# Patient Record
Sex: Male | Born: 1955 | Race: Black or African American | Hispanic: No | Marital: Married | Smoking: Current every day smoker
Health system: Southern US, Community
[De-identification: ages and names within clinical notes are randomized; demographics above are authoritative.]

## PROBLEM LIST (undated history)

## (undated) DIAGNOSIS — R918 Other nonspecific abnormal finding of lung field: Secondary | ICD-10-CM

## (undated) DIAGNOSIS — R0600 Dyspnea, unspecified: Secondary | ICD-10-CM

## (undated) DIAGNOSIS — Z720 Tobacco use: Secondary | ICD-10-CM

## (undated) DIAGNOSIS — C3491 Malignant neoplasm of unspecified part of right bronchus or lung: Secondary | ICD-10-CM

## (undated) DIAGNOSIS — I313 Pericardial effusion (noninflammatory): Secondary | ICD-10-CM

## (undated) DIAGNOSIS — E46 Unspecified protein-calorie malnutrition: Secondary | ICD-10-CM

## (undated) DIAGNOSIS — E871 Hypo-osmolality and hyponatremia: Secondary | ICD-10-CM

## (undated) DIAGNOSIS — I10 Essential (primary) hypertension: Secondary | ICD-10-CM

## (undated) DIAGNOSIS — E222 Syndrome of inappropriate secretion of antidiuretic hormone: Secondary | ICD-10-CM

## (undated) DIAGNOSIS — I3139 Other pericardial effusion (noninflammatory): Secondary | ICD-10-CM

## (undated) DIAGNOSIS — F1010 Alcohol abuse, uncomplicated: Secondary | ICD-10-CM

## (undated) DIAGNOSIS — J90 Pleural effusion, not elsewhere classified: Secondary | ICD-10-CM

---

## 2015-02-28 HISTORY — PX: BRONCHOSCOPY: SUR163

## 2015-03-18 ENCOUNTER — Emergency Department (HOSPITAL_COMMUNITY): Payer: Self-pay

## 2015-03-18 ENCOUNTER — Emergency Department (HOSPITAL_COMMUNITY)
Admission: EM | Admit: 2015-03-18 | Discharge: 2015-03-18 | Discharge status: Against Medical Advice | Payer: Self-pay | Attending: Emergency Medicine | Admitting: Emergency Medicine

## 2015-03-18 ENCOUNTER — Encounter (HOSPITAL_COMMUNITY): Payer: Self-pay | Admitting: Cardiology

## 2015-03-18 DIAGNOSIS — Z8709 Personal history of other diseases of the respiratory system: Secondary | ICD-10-CM | POA: Insufficient documentation

## 2015-03-18 DIAGNOSIS — R0600 Dyspnea, unspecified: Secondary | ICD-10-CM

## 2015-03-18 DIAGNOSIS — C3491 Malignant neoplasm of unspecified part of right bronchus or lung: Secondary | ICD-10-CM | POA: Insufficient documentation

## 2015-03-18 DIAGNOSIS — R918 Other nonspecific abnormal finding of lung field: Secondary | ICD-10-CM | POA: Insufficient documentation

## 2015-03-18 DIAGNOSIS — Z720 Tobacco use: Secondary | ICD-10-CM | POA: Insufficient documentation

## 2015-03-18 DIAGNOSIS — I10 Essential (primary) hypertension: Secondary | ICD-10-CM | POA: Insufficient documentation

## 2015-03-18 DIAGNOSIS — E222 Syndrome of inappropriate secretion of antidiuretic hormone: Secondary | ICD-10-CM | POA: Insufficient documentation

## 2015-03-18 DIAGNOSIS — Z79899 Other long term (current) drug therapy: Secondary | ICD-10-CM | POA: Insufficient documentation

## 2015-03-18 DIAGNOSIS — R0602 Shortness of breath: Secondary | ICD-10-CM

## 2015-03-18 HISTORY — DX: Other pericardial effusion (noninflammatory): I31.39

## 2015-03-18 HISTORY — DX: Alcohol abuse, uncomplicated: F10.10

## 2015-03-18 HISTORY — DX: Unspecified protein-calorie malnutrition: E46

## 2015-03-18 HISTORY — DX: Other nonspecific abnormal finding of lung field: R91.8

## 2015-03-18 HISTORY — DX: Pleural effusion, not elsewhere classified: J90

## 2015-03-18 HISTORY — DX: Pericardial effusion (noninflammatory): I31.3

## 2015-03-18 HISTORY — DX: Malignant neoplasm of unspecified part of right bronchus or lung: C34.91

## 2015-03-18 HISTORY — DX: Tobacco use: Z72.0

## 2015-03-18 HISTORY — DX: Hypo-osmolality and hyponatremia: E87.1

## 2015-03-18 HISTORY — DX: Essential (primary) hypertension: I10

## 2015-03-18 HISTORY — DX: Dyspnea, unspecified: R06.00

## 2015-03-18 HISTORY — DX: Syndrome of inappropriate secretion of antidiuretic hormone: E22.2

## 2015-03-18 LAB — COMPREHENSIVE METABOLIC PANEL
ALBUMIN: 2.9 g/dL — AB (ref 3.5–5.0)
ALK PHOS: 94 U/L (ref 38–126)
ALT: 20 U/L (ref 17–63)
ANION GAP: 9 (ref 5–15)
AST: 28 U/L (ref 15–41)
BUN: 9 mg/dL (ref 6–20)
CHLORIDE: 87 mmol/L — AB (ref 101–111)
CO2: 26 mmol/L (ref 22–32)
Calcium: 8.5 mg/dL — ABNORMAL LOW (ref 8.9–10.3)
Creatinine, Ser: 0.46 mg/dL — ABNORMAL LOW (ref 0.61–1.24)
GFR calc Af Amer: >60 mL/min (ref >60)
GFR calc non Af Amer: >60 mL/min (ref >60)
Glucose, Bld: 97 mg/dL (ref 65–99)
POTASSIUM: 4.1 mmol/L (ref 3.5–5.1)
Sodium: 122 mmol/L — ABNORMAL LOW (ref 135–145)
Total Bilirubin: 0.6 mg/dL (ref 0.3–1.2)
Total Protein: 7.2 g/dL (ref 6.5–8.1)

## 2015-03-18 LAB — CBC WITH DIFFERENTIAL/PLATELET
Basophils Absolute: 0 10*3/uL (ref 0.0–0.1)
Basophils Relative: 0 % (ref 0–1)
Eosinophils Absolute: 0 10*3/uL (ref 0.0–0.7)
Eosinophils Relative: 0 % (ref 0–5)
HEMATOCRIT: 35.5 % — AB (ref 39.0–52.0)
HEMOGLOBIN: 12 g/dL — AB (ref 13.0–17.0)
LYMPHS ABS: 1.4 10*3/uL (ref 0.7–4.0)
LYMPHS PCT: 9 % — AB (ref 12–46)
MCH: 28.8 pg (ref 26.0–34.0)
MCHC: 33.8 g/dL (ref 30.0–36.0)
MCV: 85.1 fL (ref 78.0–100.0)
MONO ABS: 1.3 10*3/uL — AB (ref 0.1–1.0)
Monocytes Relative: 8 % (ref 3–12)
Neutro Abs: 13 10*3/uL — ABNORMAL HIGH (ref 1.7–7.7)
Neutrophils Relative %: 83 % — ABNORMAL HIGH (ref 43–77)
Platelets: 569 10*3/uL — ABNORMAL HIGH (ref 150–400)
RBC: 4.17 MIL/uL — ABNORMAL LOW (ref 4.22–5.81)
RDW: 12.8 % (ref 11.5–15.5)
WBC: 15.7 10*3/uL — ABNORMAL HIGH (ref 4.0–10.5)

## 2015-03-18 LAB — URINALYSIS, ROUTINE W REFLEX MICROSCOPIC
Bilirubin Urine: NEGATIVE
Glucose, UA: NEGATIVE mg/dL
HGB URINE DIPSTICK: NEGATIVE
KETONES UR: NEGATIVE mg/dL
Leukocytes, UA: NEGATIVE
Nitrite: NEGATIVE
PROTEIN: NEGATIVE mg/dL
Specific Gravity, Urine: 1.025 (ref 1.005–1.030)
UROBILINOGEN UA: 1 mg/dL (ref 0.0–1.0)
pH: 6.5 (ref 5.0–8.0)

## 2015-03-18 LAB — BRAIN NATRIURETIC PEPTIDE: B NATRIURETIC PEPTIDE 5: 133 pg/mL — AB (ref 0.0–100.0)

## 2015-03-18 LAB — TROPONIN I

## 2015-03-18 MED ORDER — IPRATROPIUM-ALBUTEROL 0.5-2.5 (3) MG/3ML IN SOLN
3.0000 mL | Freq: Once | RESPIRATORY_TRACT | Status: AC
  Administered 2015-03-18: 3 mL via RESPIRATORY_TRACT
  Filled 2015-03-18: qty 3

## 2015-03-18 NOTE — ED Notes (Signed)
Sob for months that is getting worse.  Chest pain ,  Right shoulder and upper back pain.  Has been seen for this and told he had fluid on his lungs and was told he has a mass.  To be following up for the mass.

## 2015-03-18 NOTE — ED Notes (Signed)
MD stated okay to d/c d/t pt requesting d/c at this time.

## 2015-03-18 NOTE — ED Notes (Signed)
Dr. Thurnell Garbe approved patient having water.  Grayland Ormond took patient a cup of water.   Advised patient we still needed a urine specimen.

## 2015-03-18 NOTE — ED Notes (Signed)
Patient o2 sat maintained at 93-97% while ambulating on room air.  Patient was coughing but could talk to me while walking.

## 2015-03-18 NOTE — ED Notes (Signed)
MD at bedside. 

## 2015-03-18 NOTE — ED Provider Notes (Signed)
CSN: [id number]     Arrival date & time 03/18/15  1113 History   First MD Initiated Contact with Patient 03/18/15 1205     Chief Complaint  Patient presents with  . Shortness of Breath      HPI Pt was seen at 1210.  Per pt and his family, c/o gradual onset and persistence of constant SOB and right sided chest "pain" for the past 6 months. Pain radiates into the right side of his back. Has been associated with cough. Pt states he was evaluated at [facility] for same and told he "had a mass in my chest" and "fluid on my lungs." Pt states "then they didn't do anything about it." Pt states he was admitted to [facility] 2 weeks ago "but nothing was done" and "they just took my blood pressure." Pt states he came to the ED today "because I want another opinion" and "I want something done." Pt denies any change in his symptoms. Denies fevers, no abd pain, no N/V/D, no palpitations, no calf/LE pain or unilateral swelling.    Past Medical History  Diagnosis Date  . Hypertension   . Lung mass     being worked up in [place] Dr. Frutoso Schatz, Pulm Dr. Koleen Nimrod  . Hyponatremia   . 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 Alcohol abuse   . Dyspnea     on home O2  . Tobacco abuse   . Malnutrition   . Pleural effusion, bilateral   . Pericardial effusion   . Non-small cell cancer of right lung    Past Surgical History  Procedure Laterality Date  . Bronchoscopy  02/28/15    [facility]    History  Substance Use Topics  . Smoking status: Current Every Day Smoker  . Smokeless tobacco: Not on file  . Alcohol Use: Yes     Comment: 2-3 beers daily     Review of Systems ROS: Statement: All systems negative except as marked or noted in the HPI; Constitutional: Negative for fever and chills. ; ; Eyes: Negative for eye pain, redness and discharge. ; ; ENMT: Negative for ear pain, hoarseness, nasal congestion, sinus pressure and  sore throat. ; ; Cardiovascular: +"CP for 6 months." Negative for palpitations, diaphoresis, and peripheral edema. ; ; Respiratory: +"SOB and cough for 6 months." Negative for wheezing and stridor. ; ; Gastrointestinal: Negative for nausea, vomiting, diarrhea, abdominal pain, blood in stool, hematemesis, jaundice and rectal bleeding. . ; ; Genitourinary: Negative for dysuria, flank pain and hematuria. ; ; Musculoskeletal: Negative for back pain and neck pain. Negative for swelling and trauma.; ; Skin: Negative for pruritus, rash, abrasions, blisters, bruising and skin lesion.; ; Neuro: Negative for headache, lightheadedness and neck stiffness. Negative for weakness, altered level of consciousness , altered mental status, extremity weakness, paresthesias, involuntary movement, seizure and syncope.      Allergies  Review of patient's allergies indicates no known allergies.  Home Medications   Prior to Admission medications   Medication Sig Start Date End Date Taking? Authorizing Provider  amLODipine (NORVASC) 5 MG tablet Take 5 mg by mouth daily.   Yes Historical Provider, MD  ferrous sulfate 325 (65 FE) MG tablet Take 325 mg by mouth daily with breakfast.   Yes Historical Provider, MD  folic acid (FOLVITE) 1 MG tablet Take 1 mg by mouth daily.   Yes Historical Provider, MD  ibuprofen (ADVIL,MOTRIN) 200 MG tablet Take  800 mg by mouth every 6 (six) hours as needed.   Yes Historical Provider, MD  Multiple Vitamin (MULTIVITAMIN) tablet Take 1 tablet by mouth daily.   Yes Historical Provider, MD  thiamine (VITAMIN B-1) 100 MG tablet Take 100 mg by mouth daily.   Yes Historical Provider, MD   BP 140/92 mmHg  Pulse 90  Temp(Src) 98 F (36.7 C) (Oral)  Resp 22  Ht '6\' 1"'$  (1.854 m)  Wt 163 lb 14.4 oz (74.345 kg)  BMI 21.63 kg/m2  SpO2 97% Physical Exam  1215; Physical examination:  Nursing notes reviewed; Vital signs and O2 SAT reviewed;  Constitutional: Well developed, Well nourished, Well  hydrated, In no acute distress; Head:  Normocephalic, atraumatic; Eyes: EOMI, PERRL, No scleral icterus; ENMT: Mouth and pharynx normal, Mucous membranes moist; Neck: Supple, Full range of motion, No lymphadenopathy; Cardiovascular: Regular rate and rhythm, No gallop; Respiratory: Breath sounds coarse & equal bilaterally, No wheezes.  Speaking full sentences with ease, Normal respiratory effort/excursion; Chest: Nontender, Movement normal; Abdomen: Soft, Nontender, Nondistended, Normal bowel sounds; Genitourinary: No CVA tenderness; Extremities: Pulses normal, No tenderness, No edema, No calf edema or asymmetry.; Neuro: AA&Ox3, Major CN grossly intact.  Speech clear. No gross focal motor or sensory deficits in extremities.; Skin: Color normal, Warm, Dry.   ED Course  Procedures      EKG Interpretation   Date/Time:  Tuesday March 18 2015 11:22:36 EDT Ventricular Rate:  98 PR Interval:  135 QRS Duration: 86 QT Interval:  343 QTC Calculation: 438 R Axis:   69 Text Interpretation:  Sinus rhythm Probable left atrial enlargement  Anterior infarct, old Baseline wander No old tracing to compare Confirmed  by [facility]  MD, Nunzio Cory [phone]) on 03/18/2015 12:32:45 PM      MDM  MDM Reviewed: previous chart, nursing note and vitals Reviewed previous: labs, ECG, MRI and CT scan Interpretation: labs, ECG and x-ray      Results for orders placed or performed during the hospital encounter of 03/18/15  CBC with Differential  Result Value Ref Range   WBC 15.7 (H) 4.0 - 10.5 K/uL   RBC 4.17 (L) 4.22 - 5.81 MIL/uL   Hemoglobin 12.0 (L) 13.0 - 17.0 g/dL   HCT 35.5 (L) 39.0 - 52.0 %   MCV 85.1 78.0 - 100.0 fL   MCH 28.8 26.0 - 34.0 pg   MCHC 33.8 30.0 - 36.0 g/dL   RDW 12.8 11.5 - 15.5 %   Platelets 569 (H) 150 - 400 K/uL   Neutrophils Relative % 83 (H) 43 - 77 %   Neutro Abs 13.0 (H) 1.7 - 7.7 K/uL   Lymphocytes Relative 9 (L) 12 - 46 %   Lymphs Abs 1.4 0.7 - 4.0 K/uL   Monocytes Relative 8  3 - 12 %   Monocytes Absolute 1.3 (H) 0.1 - 1.0 K/uL   Eosinophils Relative 0 0 - 5 %   Eosinophils Absolute 0.0 0.0 - 0.7 K/uL   Basophils Relative 0 0 - 1 %   Basophils Absolute 0.0 0.0 - 0.1 K/uL  Comprehensive metabolic panel  Result Value Ref Range   Sodium 122 (L) 135 - 145 mmol/L   Potassium 4.1 3.5 - 5.1 mmol/L   Chloride 87 (L) 101 - 111 mmol/L   CO2 26 22 - 32 mmol/L   Glucose, Bld 97 65 - 99 mg/dL   BUN 9 6 - 20 mg/dL   Creatinine, Ser 0.46 (L) 0.61 - 1.24 mg/dL   Calcium 8.5 (  L) 8.9 - 10.3 mg/dL   Total Protein 7.2 6.5 - 8.1 g/dL   Albumin 2.9 (L) 3.5 - 5.0 g/dL   AST 28 15 - 41 U/L   ALT 20 17 - 63 U/L   Alkaline Phosphatase 94 38 - 126 U/L   Total Bilirubin 0.6 0.3 - 1.2 mg/dL   GFR calc non Af Amer >60 >60 mL/min   GFR calc Af Amer >60 >60 mL/min   Anion gap 9 5 - 15  Troponin I  Result Value Ref Range   Troponin I <0.03 <0.031 ng/mL  Brain natriuretic peptide  Result Value Ref Range   B Natriuretic Peptide 133.0 (H) 0.0 - 100.0 pg/mL  Urinalysis, Routine w reflex microscopic (not at [facility])  Result Value Ref Range   Color, Urine YELLOW YELLOW   APPearance CLEAR CLEAR   Specific Gravity, Urine 1.025 1.005 - 1.030   pH 6.5 5.0 - 8.0   Glucose, UA NEGATIVE NEGATIVE mg/dL   Hgb urine dipstick NEGATIVE NEGATIVE   Bilirubin Urine NEGATIVE NEGATIVE   Ketones, ur NEGATIVE NEGATIVE mg/dL   Protein, ur NEGATIVE NEGATIVE mg/dL   Urobilinogen, UA 1.0 0.0 - 1.0 mg/dL   Nitrite NEGATIVE NEGATIVE   Leukocytes, UA NEGATIVE NEGATIVE   Dg Chest Port 1 View 03/18/2015   CLINICAL DATA:  Chest pain and shortness of Breath  EXAM: PORTABLE CHEST - 1 VIEW  COMPARISON:  None.  FINDINGS: There is a 1.5 x 1.1 cm nodular opacity in the medial aspect of the right upper lobe. There are pleural effusions bilaterally with bibasilar atelectatic change. Heart is mildly enlarged. The pulmonary vascularity is within normal limits.  There is mild fullness in the right hilar region.  No bone  lesions.  IMPRESSION: Nodular appearing opacity right upper lobe medially measuring 1.5 x 1.1 cm. Mild right hilar fullness. Given these findings, contrast enhanced chest CT is advised to further evaluate.  There are small pleural effusions bilaterally with bibasilar atelectasis. There is mild cardiomegaly. There may be a degree of underlying congestive heart failure.   Electronically Signed   By: Lowella Grip III M.D.   On: 03/18/2015 12:21    1350:  Neb given. Pt has ambulated with Sats 93-97% R/A, speaking full sentences without distress.  Pt has tol PO well without N/V. Pt's VS remain stable. 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 Pt had f/u appt with Heme/Onc Dr. Frutoso Schatz on 6/8 and Pulm Dr. Koleen Nimrod on 6/6.  T/C to Heme/Onc Dr. Frutoso Schatz, case discussed, including:  HPI, pertinent PM/SHx, VS/PE, dx testing, ED course and treatment:  Dr. Frutoso Schatz is away until Monday and I spoke with her staff, staff states they know this pt well, pt is currently in the middle of his workup/staging for his lung mass, pt was seen in the office after his admission and again last week for his symptoms and was rx home oxygen; pt has an office appt on Monday where a PET scan will be discussed; office staff states notes indicate that pt and his  family were told of his CA dx but may not be comprehending completely what his workup/clinical course entails.  I then went to speak with pt and his wife. Pt has currently gotten himself dressed and is  sitting off the side of the stretcher requesting his "IV be taken out" because he "wants to leave now." I reiterated all the above to the pt and his wife. Pt states "they're not doing anything" and he "just wants a shot and be all better."  Pt informed that this was an unrealistic expectation, given his dx of cancer, and his CA workup will likely take several weeks. Pt requesting "a second opinion." I offered to call [facility] Heme/Onc MD to review his case. Pt and his wife then said, "never mind, we're just gonna leave." Pt then walked out of the ED with is family.     Francine Graven, DO 03/21/15 1816

## 2015-11-26 DEATH — deceased

## 2016-06-07 IMAGING — CR DG CHEST 1V PORT
1 series · 1 of 1 positions shown · non-contrast
Comparison: None.

CLINICAL DATA: Chest pain and shortness of Breath

EXAM:
PORTABLE CHEST - 1 VIEW

[ap]
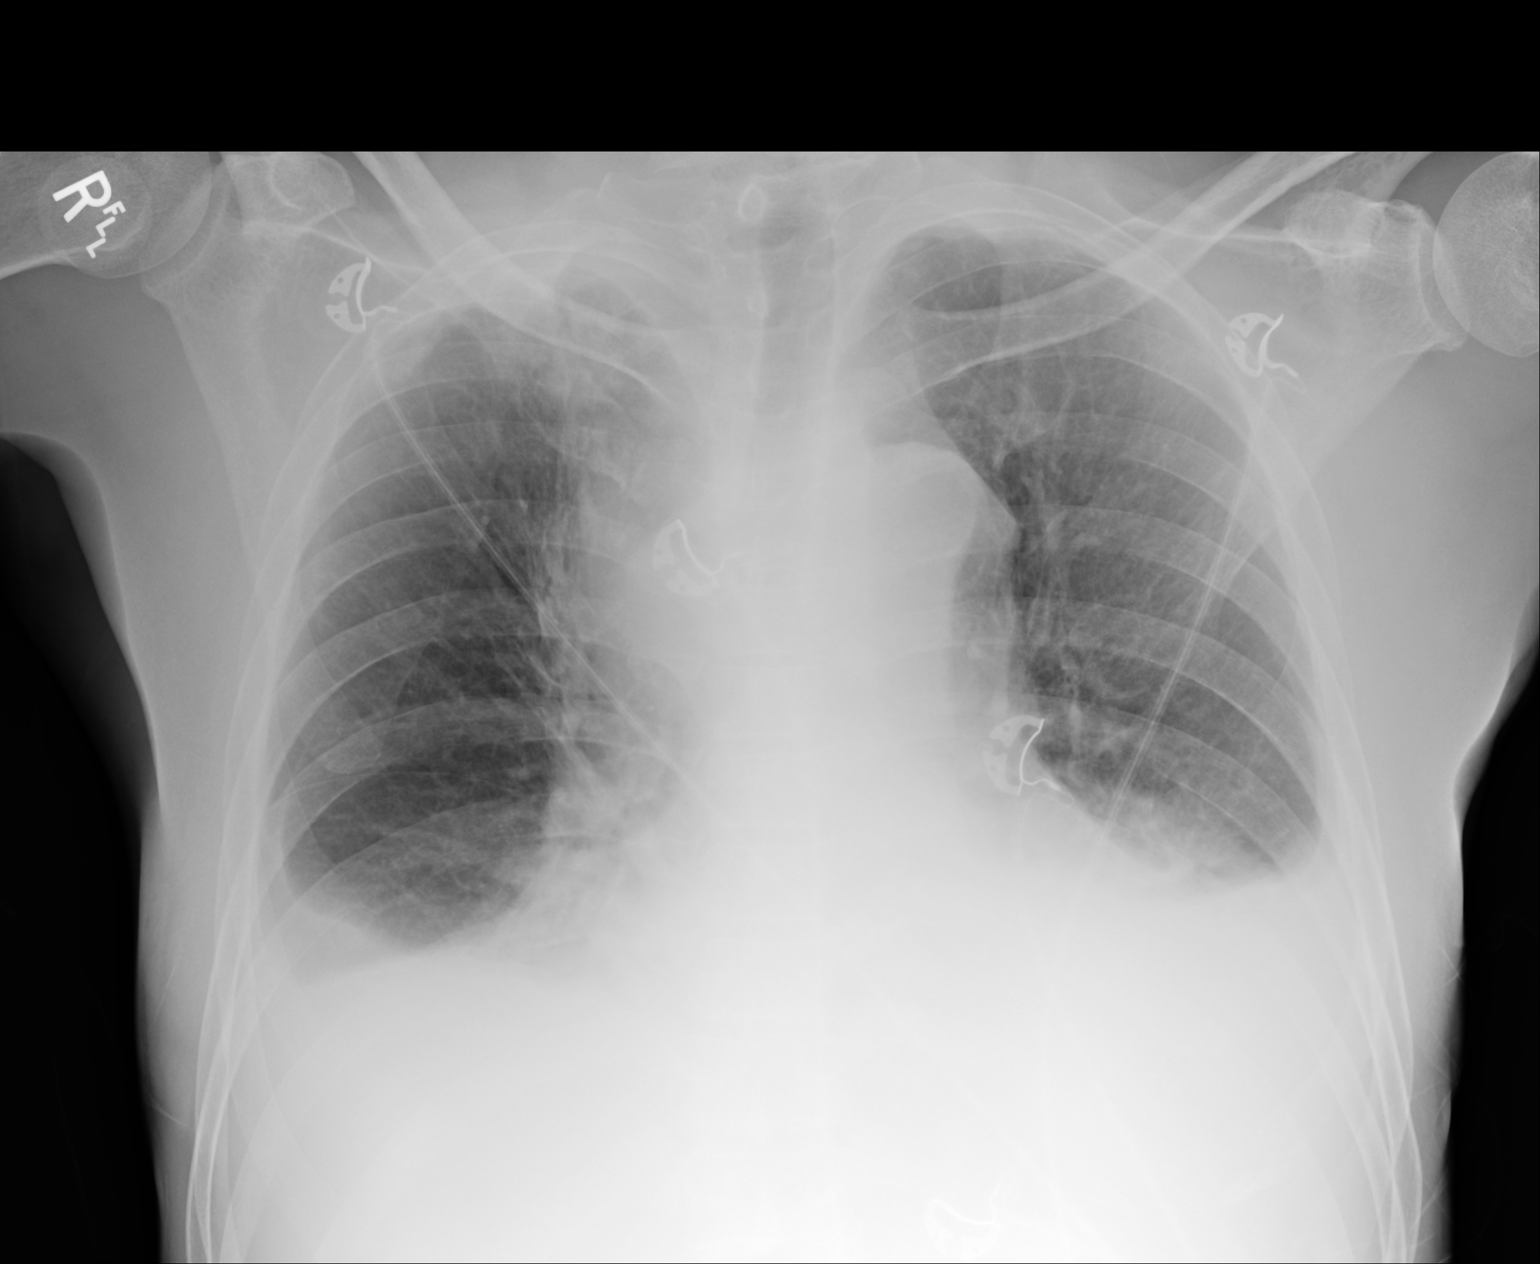

[1 of 1 positions shown; findings below may reference images not displayed]

FINDINGS: There is a 1.5 x 1.1 cm nodular opacity in the medial aspect of the
right upper lobe. There are pleural effusions bilaterally with
bibasilar atelectatic change. Heart is mildly enlarged. The
pulmonary vascularity is within normal limits.

There is mild fullness in the right hilar region.  No bone lesions.
IMPRESSION: Nodular appearing opacity right upper lobe medially measuring 1.5 x
1.1 cm. Mild right hilar fullness. Given these findings, contrast
enhanced chest CT is advised to further evaluate.

There are small pleural effusions bilaterally with bibasilar
atelectasis. There is mild cardiomegaly. There may be a degree of
underlying congestive heart failure.
